# Patient Record
Sex: Male | Born: 1965 | Race: White | Hispanic: No | Marital: Married | Smoking: Never smoker
Health system: Southern US, Community
[De-identification: ages and names within clinical notes are randomized; demographics above are authoritative.]

## PROBLEM LIST (undated history)

## (undated) DIAGNOSIS — E119 Type 2 diabetes mellitus without complications: Secondary | ICD-10-CM

## (undated) DIAGNOSIS — I10 Essential (primary) hypertension: Secondary | ICD-10-CM

## (undated) HISTORY — PX: NO PAST SURGERIES: SHX2092

---

## 2007-12-01 ENCOUNTER — Ambulatory Visit: Payer: Self-pay | Admitting: Family Medicine

## 2007-12-01 DIAGNOSIS — R5383 Other fatigue: Secondary | ICD-10-CM

## 2007-12-01 DIAGNOSIS — R5381 Other malaise: Secondary | ICD-10-CM

## 2007-12-01 DIAGNOSIS — F17200 Nicotine dependence, unspecified, uncomplicated: Secondary | ICD-10-CM

## 2007-12-01 DIAGNOSIS — L0293 Carbuncle, unspecified: Secondary | ICD-10-CM

## 2007-12-01 DIAGNOSIS — R351 Nocturia: Secondary | ICD-10-CM

## 2007-12-01 DIAGNOSIS — Z720 Tobacco use: Secondary | ICD-10-CM | POA: Insufficient documentation

## 2007-12-01 DIAGNOSIS — Z7721 Contact with and (suspected) exposure to potentially hazardous body fluids: Secondary | ICD-10-CM | POA: Insufficient documentation

## 2007-12-01 DIAGNOSIS — L0292 Furuncle, unspecified: Secondary | ICD-10-CM | POA: Insufficient documentation

## 2007-12-01 DIAGNOSIS — G479 Sleep disorder, unspecified: Secondary | ICD-10-CM | POA: Insufficient documentation

## 2007-12-02 ENCOUNTER — Encounter: Payer: Self-pay | Admitting: Family Medicine

## 2007-12-02 LAB — CONVERTED CEMR LAB
AST: 29 units/L (ref 0–37)
Albumin: 3.8 g/dL (ref 3.5–5.2)
Alkaline Phosphatase: 70 units/L (ref 39–117)
BUN: 14 mg/dL (ref 6–23)
Basophils Relative: 2.6 % (ref 0.0–3.0)
Chloride: 106 meq/L (ref 96–112)
Creatinine, Ser: 1.1 mg/dL (ref 0.4–1.5)
Eosinophils Absolute: 0.4 10*3/uL (ref 0.0–0.7)
Eosinophils Relative: 5 % (ref 0.0–5.0)
Glucose, Bld: 88 mg/dL (ref 70–99)
HCT: 44.5 % (ref 39.0–52.0)
MCV: 95.5 fL (ref 78.0–100.0)
Monocytes Absolute: 0.3 10*3/uL (ref 0.1–1.0)
Monocytes Relative: 4.6 % (ref 3.0–12.0)
Platelets: 222 10*3/uL (ref 150–400)
Potassium: 4 meq/L (ref 3.5–5.1)
RBC: 4.66 M/uL (ref 4.22–5.81)
TSH: 2.1 microintl units/mL (ref 0.35–5.50)
Total Protein: 7.2 g/dL (ref 6.0–8.3)
WBC: 7.2 10*3/uL (ref 4.5–10.5)

## 2007-12-06 ENCOUNTER — Telehealth: Payer: Self-pay | Admitting: Family Medicine

## 2007-12-12 ENCOUNTER — Encounter: Payer: Self-pay | Admitting: Family Medicine

## 2008-01-02 ENCOUNTER — Ambulatory Visit: Payer: Self-pay | Admitting: Family Medicine

## 2008-01-08 LAB — CONVERTED CEMR LAB
HDL: 28.2 mg/dL — ABNORMAL LOW (ref >39.0)
LDL Cholesterol: 119 mg/dL — ABNORMAL HIGH (ref 0–99)
Total CHOL/HDL Ratio: 6.1
Triglycerides: 127 mg/dL (ref 0–149)
VLDL: 25 mg/dL (ref 0–40)

## 2009-10-20 ENCOUNTER — Emergency Department: Payer: Self-pay | Admitting: Emergency Medicine

## 2010-06-15 ENCOUNTER — Emergency Department: Payer: Self-pay | Admitting: Emergency Medicine

## 2010-09-09 DIAGNOSIS — E1169 Type 2 diabetes mellitus with other specified complication: Secondary | ICD-10-CM | POA: Insufficient documentation

## 2010-09-09 DIAGNOSIS — E7800 Pure hypercholesterolemia, unspecified: Secondary | ICD-10-CM | POA: Insufficient documentation

## 2010-09-09 DIAGNOSIS — I10 Essential (primary) hypertension: Secondary | ICD-10-CM | POA: Insufficient documentation

## 2012-03-01 IMAGING — CR DG CHEST 1V PORT
1 series · 1 of 1 positions shown · non-contrast
Comparison: none

REASON FOR EXAM: Chest Pain
COMMENTS:

PROCEDURE:     DXR - DXR PORTABLE CHEST SINGLE VIEW  - June 15, 2010  [DATE]
RESULT:     Comparison: None.

[view not recorded]
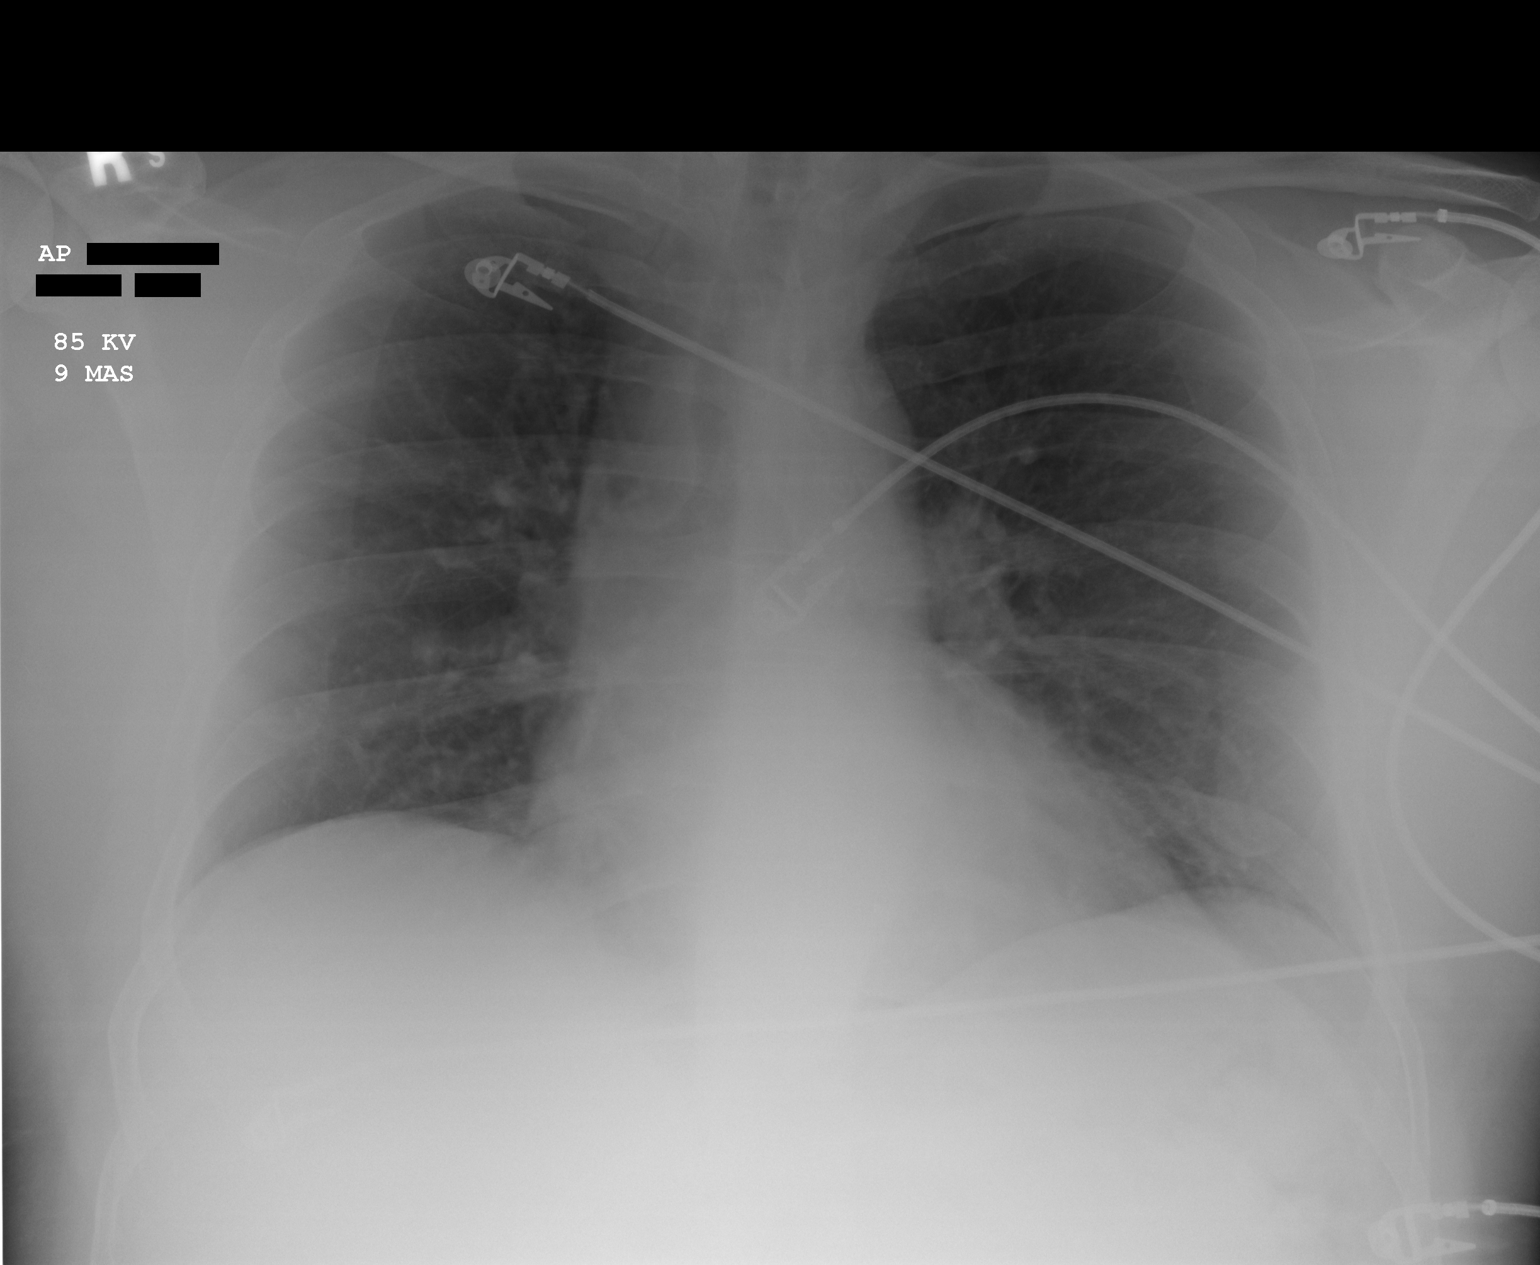

[1 of 1 positions shown; findings below may reference images not displayed]

FINDINGS: Heart size upper limits of normal. Mild prominence of the superior
mediastinum likely related to patient rotation to the left. The lung volumes
are slightly diminished. Otherwise, no focal pulmonary opacities.
IMPRESSION: No acute cardio pulmonary disease.

## 2012-12-13 DIAGNOSIS — E1165 Type 2 diabetes mellitus with hyperglycemia: Secondary | ICD-10-CM | POA: Insufficient documentation

## 2012-12-13 DIAGNOSIS — E119 Type 2 diabetes mellitus without complications: Secondary | ICD-10-CM | POA: Insufficient documentation

## 2013-08-24 DIAGNOSIS — M5417 Radiculopathy, lumbosacral region: Secondary | ICD-10-CM

## 2015-04-05 ENCOUNTER — Ambulatory Visit
Admission: EM | Admit: 2015-04-05 | Discharge: 2015-04-05 | Disposition: A | Discharge status: Home / Self Care | Payer: BC Managed Care – PPO | Attending: Family Medicine | Admitting: Family Medicine

## 2015-04-05 DIAGNOSIS — R0789 Other chest pain: Secondary | ICD-10-CM | POA: Diagnosis not present

## 2015-04-05 DIAGNOSIS — R0609 Other forms of dyspnea: Secondary | ICD-10-CM | POA: Diagnosis not present

## 2015-04-05 DIAGNOSIS — R059 Cough, unspecified: Secondary | ICD-10-CM

## 2015-04-05 DIAGNOSIS — R0600 Dyspnea, unspecified: Secondary | ICD-10-CM

## 2015-04-05 DIAGNOSIS — R079 Chest pain, unspecified: Secondary | ICD-10-CM

## 2015-04-05 DIAGNOSIS — R05 Cough: Secondary | ICD-10-CM

## 2015-04-05 HISTORY — DX: Type 2 diabetes mellitus without complications: E11.9

## 2015-04-05 HISTORY — DX: Essential (primary) hypertension: I10

## 2015-04-05 NOTE — Discharge Instructions (Signed)
Electrocardiography Electrocardiography is a test that records the electrical impulses of the heart. It assesses many aspects of heart health, including:  Heart function.  Heart rhythm.  Heart muscle thickness. Electrocardiography can be done as a routine part of a physical exam. It can also be done to evaluate symptoms such as severe chest pain and heart palpitations. PROCEDURE   Electrocardiography is simple, safe, and painless. No electricity goes through your body during the procedure.  You will be asked to remove your clothes from the waist up and lie on your back for the test.  Sticky patches (electrodes) will be placed on your chest, arms, and legs. The electrodes will be attached by wires to the electrocardiography machine.  You will be asked to relax and lie still for a few seconds while the electrocardiography machine records the electrical activity of your heart. AFTER THE PROCEDURE  If electrocardiography is part of a routine physical exam, you may return to normal activities as told by your health care provider.  Your health care provider or a heart doctor (cardiologist) will interpret the recording.  The test result may not be available during your visit. If your test result is not back during the visit, make an appointment with your health care provider to find out the result. It is your responsibility to get your test results.   This information is not intended to replace advice given to you by your health care provider. Make sure you discuss any questions you have with your health care provider.   Document Released: 01/17/2000 Document Revised: 02/09/2014 Document Reviewed: 06/01/2011 Elsevier Interactive Patient Education 2016 Else[employer] Inc.  Nonspecific Chest Pain  Chest pain can be caused by many different conditions. There is always a chance that your pain could be related to something serious, such as a heart attack or a blood clot in your lungs. Chest pain can  also be caused by conditions that are not life-threatening. If you have chest pain, it is very important to follow up with your health care provider. CAUSES  Chest pain can be caused by:  Heartburn.  Pneumonia or bronchitis.  Anxiety or stress.  Inflammation around your heart (pericarditis) or lung (pleuritis or pleurisy).  A blood clot in your lung.  A collapsed lung (pneumothorax). It can develop suddenly on its own (spontaneous pneumothorax) or from trauma to the chest.  Shingles infection (varicella-zoster virus).  Heart attack.  Damage to the bones, muscles, and cartilage that make up your chest wall. This can include:  Bruised bones due to injury.  Strained muscles or cartilage due to frequent or repeated coughing or overwork.  Fracture to one or more ribs.  Sore cartilage due to inflammation (costochondritis). RISK FACTORS  Risk factors for chest pain may include:  Activities that increase your risk for trauma or injury to your chest.  Respiratory infections or conditions that cause frequent coughing.  Medical conditions or overeating that can cause heartburn.  Heart disease or family history of heart disease.  Conditions or health behaviors that increase your risk of developing a blood clot.  Having had chicken pox (varicella zoster). SIGNS AND SYMPTOMS Chest pain can feel like:  Burning or tingling on the surface of your chest or deep in your chest.  Crushing, pressure, aching, or squeezing pain.  Dull or sharp pain that is worse when you move, cough, or take a deep breath.  Pain that is also felt in your back, neck, shoulder, or arm, or pain that spreads to any  of these areas. Your chest pain may come and go, or it may stay constant. DIAGNOSIS Lab tests or other studies may be needed to find the cause of your pain. Your health care provider may have you take a test called an ambulatory ECG (electrocardiogram). An ECG records your heartbeat patterns at  the time the test is performed. You may also have other tests, such as:  Transthoracic echocardiogram (TTE). During echocardiography, sound waves are used to create a picture of all of the heart structures and to look at how blood flows through your heart.  Transesophageal echocardiogram (TEE).This is a more advanced imaging test that obtains images from inside your body. It allows your health care provider to see your heart in finer detail.  Cardiac monitoring. This allows your health care provider to monitor your heart rate and rhythm in real time.  Holter monitor. This is a portable device that records your heartbeat and can help to diagnose abnormal heartbeats. It allows your health care provider to track your heart activity for several days, if needed.  Stress tests. These can be done through exercise or by taking medicine that makes your heart beat more quickly.  Blood tests.  Imaging tests. TREATMENT  Your treatment depends on what is causing your chest pain. Treatment may include:  Medicines. These may include:  Acid blockers for heartburn.  Anti-inflammatory medicine.  Pain medicine for inflammatory conditions.  Antibiotic medicine, if an infection is present.  Medicines to dissolve blood clots.  Medicines to treat coronary artery disease.  Supportive care for conditions that do not require medicines. This may include:  Resting.  Applying heat or cold packs to injured areas.  Limiting activities until pain decreases. HOME CARE INSTRUCTIONS  If you were prescribed an antibiotic medicine, finish it all even if you start to feel better.  Avoid any activities that bring on chest pain.  Do not use any tobacco products, including cigarettes, chewing tobacco, or electronic cigarettes. If you need help quitting, ask your health care provider.  Do not drink alcohol.  Take medicines only as directed by your health care provider.  Keep all follow-up visits as  directed by your health care provider. This is important. This includes any further testing if your chest pain does not go away.  If heartburn is the cause for your chest pain, you may be told to keep your head raised (elevated) while sleeping. This reduces the chance that acid will go from your stomach into your esophagus.  Make lifestyle changes as directed by your health care provider. These may include:  Getting regular exercise. Ask your health care provider to suggest some activities that are safe for you.  Eating a heart-healthy diet. A registered dietitian can help you to learn healthy eating options.  Maintaining a healthy weight.  Managing diabetes, if necessary.  Reducing stress. SEEK MEDICAL CARE IF:  Your chest pain does not go away after treatment.  You have a rash with blisters on your chest.  You have a fever. SEEK IMMEDIATE MEDICAL CARE IF:   Your chest pain is worse.  You have an increasing cough, or you cough up blood.  You have severe abdominal pain.  You have severe weakness.  You faint.  You have chills.  You have sudden, unexplained chest discomfort.  You have sudden, unexplained discomfort in your arms, back, neck, or jaw.  You have shortness of breath at any time.  You suddenly start to sweat, or your skin gets clammy.  You feel nauseous or you vomit.  You suddenly feel light-headed or dizzy.  Your heart begins to beat quickly, or it feels like it is skipping beats. These symptoms may represent a serious problem that is an emergency. Do not wait to see if the symptoms will go away. Get medical help right away. Call your local emergency services (911 in the [place] Do not drive yourself to the hospital.   This information is not intended to replace advice given to you by your health care provider. Make sure you discuss any questions you have with your health care provider.   Document Released: 10/29/2004 Document Revised: 02/09/2014 Document  Reviewed: 08/25/2013 Elsevier Interactive Patient Education [employer]2016 Elsevier Inc.

## 2015-04-05 NOTE — ED Notes (Signed)
Patient complains of cough, chest tightness and shortness of breath. Patient states that cough started days ago but, shortness of breath started around 4pm. Patient states that he feels like he is developing Asthma.

## 2015-04-05 NOTE — ED Provider Notes (Signed)
CSN: [id number]     Arrival date & time 04/05/15  1941 History   First MD Initiated Contact with Patient 04/05/15 2002     Chief Complaint  Patient presents with  . Cough   (Consider location/radiation/quality/duration/timing/severity/associated sxs/prior Treatment) HPI   This 50 year old male presents accompanied by his wife. He states that he said the lump several months history of waxing and waning chest tightness shortness of breath. He states that it is mostly noticed when he is in cold weather and with these exertional. Recently he states that it is not taken as much exertion in order to precipitate the symptoms. He's been using his wife's albuterol inhaler and today he has used approximately 8 cycles has not received very much benefit mainly for only 30 minutes and then the symptoms return. He's had chest tightness and shortness of breath particularly worse today at home when he got home from work and his wife said that he was so breathless he can hardly speak. Tightness has persisted but is lessened at that time he is seen in the clinic. He thought that he may possibly be developing asthma his mother had asthma similar symptoms but that except for the chest tightness. He has had a nonproductive cough. Risk factors include diabetes mellitus high blood pressure 30-pack-year history of smoking he quit 4 years ago. He denies any diaphoresis or radiation of the pain and says that the tightness is usually substernal. There is no nausea vomiting  Past Medical History  Diagnosis Date  . Diabetes (HCC)   . Hypertension    Past Surgical History  Procedure Laterality Date  . No past surgeries     Family History  Problem Relation Age of Onset  . Asthma Mother   . Cancer Father   . Barrett's esophagus Mother    Social History  Substance Use Topics  . Smoking status: Never Smoker   . Smokeless tobacco: None  . Alcohol Use: No    Review of Systems  Constitutional: Positive for activity  change. Negative for fever, chills and fatigue.  HENT: Positive for congestion.   Respiratory: Positive for cough, chest tightness, shortness of breath and wheezing.   All other systems reviewed and are negative.   Allergies  Penicillins  Home Medications   Prior to Admission medications   Medication Sig Start Date End Date Taking? Authorizing Provider  lisinopril (PRINIVIL,ZESTRIL) 10 MG tablet Take 10 mg by mouth daily.   Yes Historical Provider, MD  metFORMIN (GLUCOPHAGE) 500 MG tablet Take 1,000 mg by mouth 2 (two) times daily with a meal.   Yes Historical Provider, MD  metoprolol (LOPRESSOR) 50 MG tablet Take 50 mg by mouth 2 (two) times daily.   Yes Historical Provider, MD   Meds Ordered and Administered this Visit  Medications - No data to display  BP 153/91 mmHg  Pulse 66  Temp(Src) 98 F (36.7 C) (Oral)  Resp 17  Ht  (1.778 m)  SpO2 98% No data found.   Physical Exam  Constitutional: He is oriented to person, place, and time. He appears well-developed and well-nourished. No distress.  HENT:  Head: Normocephalic and atraumatic.  Right Ear: External ear normal.  Left Ear: External ear normal.  Nose: Nose normal.  Mouth/Throat: Oropharynx is clear and moist. No oropharyngeal exudate.  Eyes: Conjunctivae are normal. Pupils are equal, round, and reactive to light. Right eye exhibits no discharge. Left eye exhibits no discharge.  Neck: Normal range of motion. Neck supple.  Cardiovascular:  Normal rate, regular rhythm and normal heart sounds.  Exam reveals no gallop and no friction rub.   No murmur heard. Pulmonary/Chest: Effort normal and breath sounds normal. No respiratory distress. He has no wheezes. He has no rales.  Musculoskeletal: Normal range of motion. He exhibits no edema or tenderness.  Lymphadenopathy:    He has no cervical adenopathy.  Neurological: He is alert and oriented to person, place, and time.  Skin: Skin is warm and dry. He is not  diaphoretic.  Psychiatric: He has a normal mood and affect. His behavior is normal. Judgment and thought content normal.  Nursing note and vitals reviewed.   ED Course  Procedures (including critical care time)  Labs Review Labs Reviewed - No data to display  Imaging Review No results found.   Visual Acuity Review  Right Eye Distance:   Left Eye Distance:   Bilateral Distance:    Right Eye Near:   Left Eye Near:    Bilateral Near:     ED ECG REPORT   Date: 04/05/2015  EKG Time: 8:54 PM  Rate:65  Rhythm: normal sinus rhythm,  normal EKG, normal sinus rhythm,  there are no previous tracings available for comparison  Axis: Normal  Intervals:none  ST&T Change: No acute changes  Narrative Interpretation:Normal EKG without acute changes        EKG was interpreted by myself the undersigned and agree with the interpretation of the computer. Chrissie NoaWilliam P Sava Proby,PA-C   MDM  Chest pain,atypical R/O angina.,Nstemi Cough Exertional dyspnea. Multiple cardiac risk factors  Discharge Medication List as of 04/05/2015  8:34 PM    Plan: 1. Test/x-ray results and diagnosis reviewed with patient 2. rx as per orders; risks, benefits, potential side effects reviewed with patient 3. Recommend supportive treatment with transporttation to emergency department for definitive workup and treatment. I have had a long discussion with the patient and his wife regarding his symptoms, his risk factors and , findings tonight. Very concerned with the presentation tonight and have impressed upon them the need for further workup to rule out a cardiac source for his symptoms. When seen in the clinic his symptoms had subsided quite differently than what he is explaining earlier in the afternoon ;his EKG, although normal without any acute changes, does not definitively rule out a cardiac source. He is is currently not having any discomfort and is stable. his [facility]  [facility]. They asked if they could a wait and go tomorrow but I have told them that it is my opinion and judgment that he needs to be seen tonight for definitive workup. They are agreement the wife does appears to be worried and thought that this might be what  His  Sx represented ;stated that they will make arrangements for her children to be taken care of at home and she will [facility]. 4. [facility]   Lutricia FeilWilliam P Arraya Buck, PA-C 04/05/15 2054

## 2015-05-16 DIAGNOSIS — J449 Chronic obstructive pulmonary disease, unspecified: Secondary | ICD-10-CM | POA: Insufficient documentation

## 2018-09-21 ENCOUNTER — Other Ambulatory Visit: Payer: Self-pay

## 2018-09-21 ENCOUNTER — Other Ambulatory Visit: Payer: Self-pay | Admitting: Family Medicine

## 2018-09-21 ENCOUNTER — Ambulatory Visit
Admission: RE | Admit: 2018-09-21 | Discharge: 2018-09-21 | Disposition: A | Discharge status: Home / Self Care | Payer: BC Managed Care – PPO | Source: Ambulatory Visit | Attending: Family Medicine | Admitting: Family Medicine

## 2018-09-21 ENCOUNTER — Ambulatory Visit
Admission: RE | Admit: 2018-09-21 | Discharge: 2018-09-21 | Disposition: A | Discharge status: Home / Self Care | Payer: BC Managed Care – PPO | Attending: Family Medicine | Admitting: Family Medicine

## 2018-09-21 DIAGNOSIS — R52 Pain, unspecified: Secondary | ICD-10-CM

## 2020-06-07 IMAGING — CR LEFT FOOT - COMPLETE 3+ VIEW
3 series · 3 of 3 positions shown · non-contrast
Comparison: None.

CLINICAL DATA: Pain, heel mass

EXAM:
LEFT FOOT - COMPLETE 3+ VIEW

[foot ap]
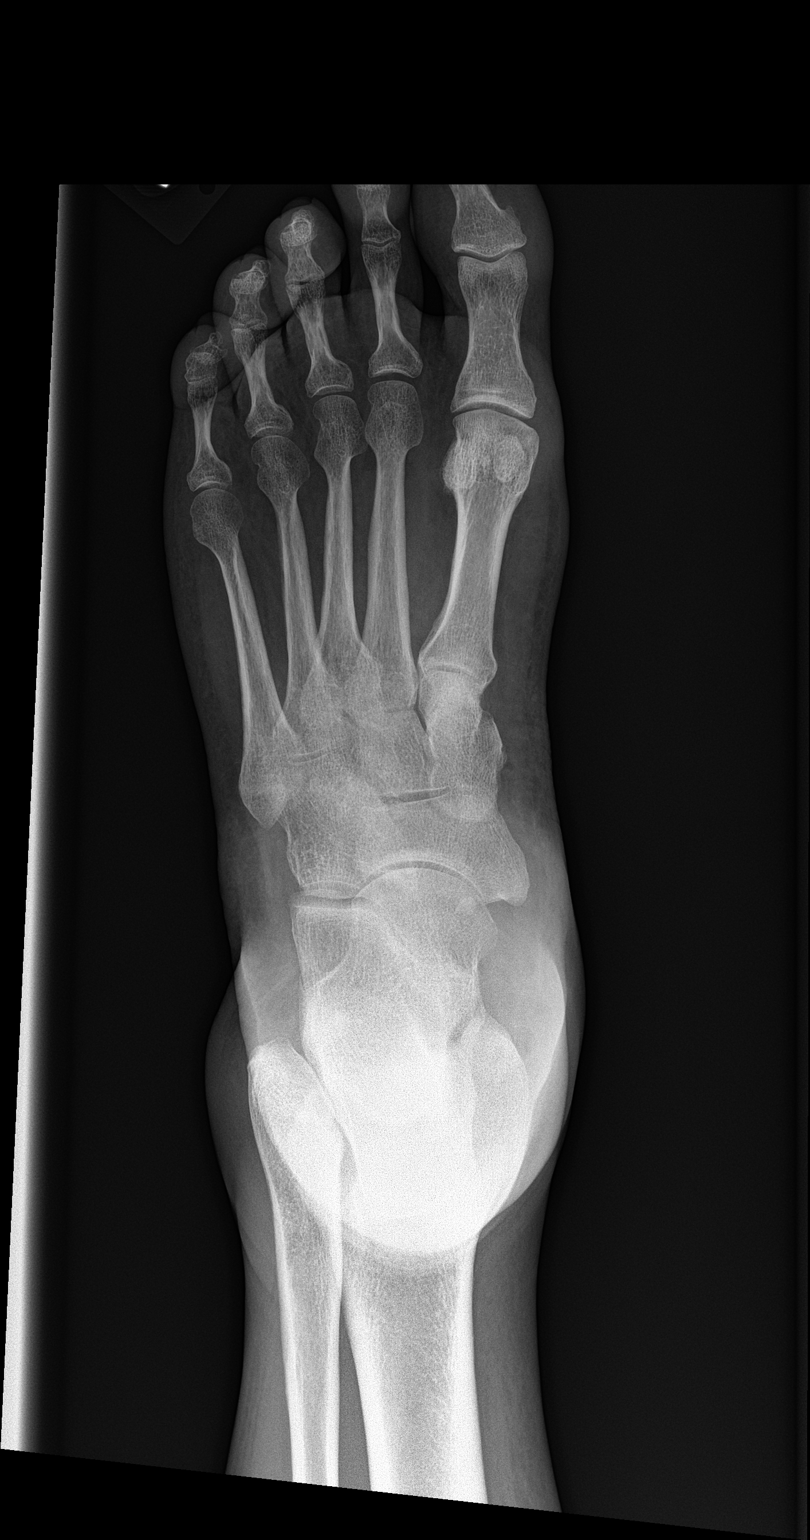

[foot obl]
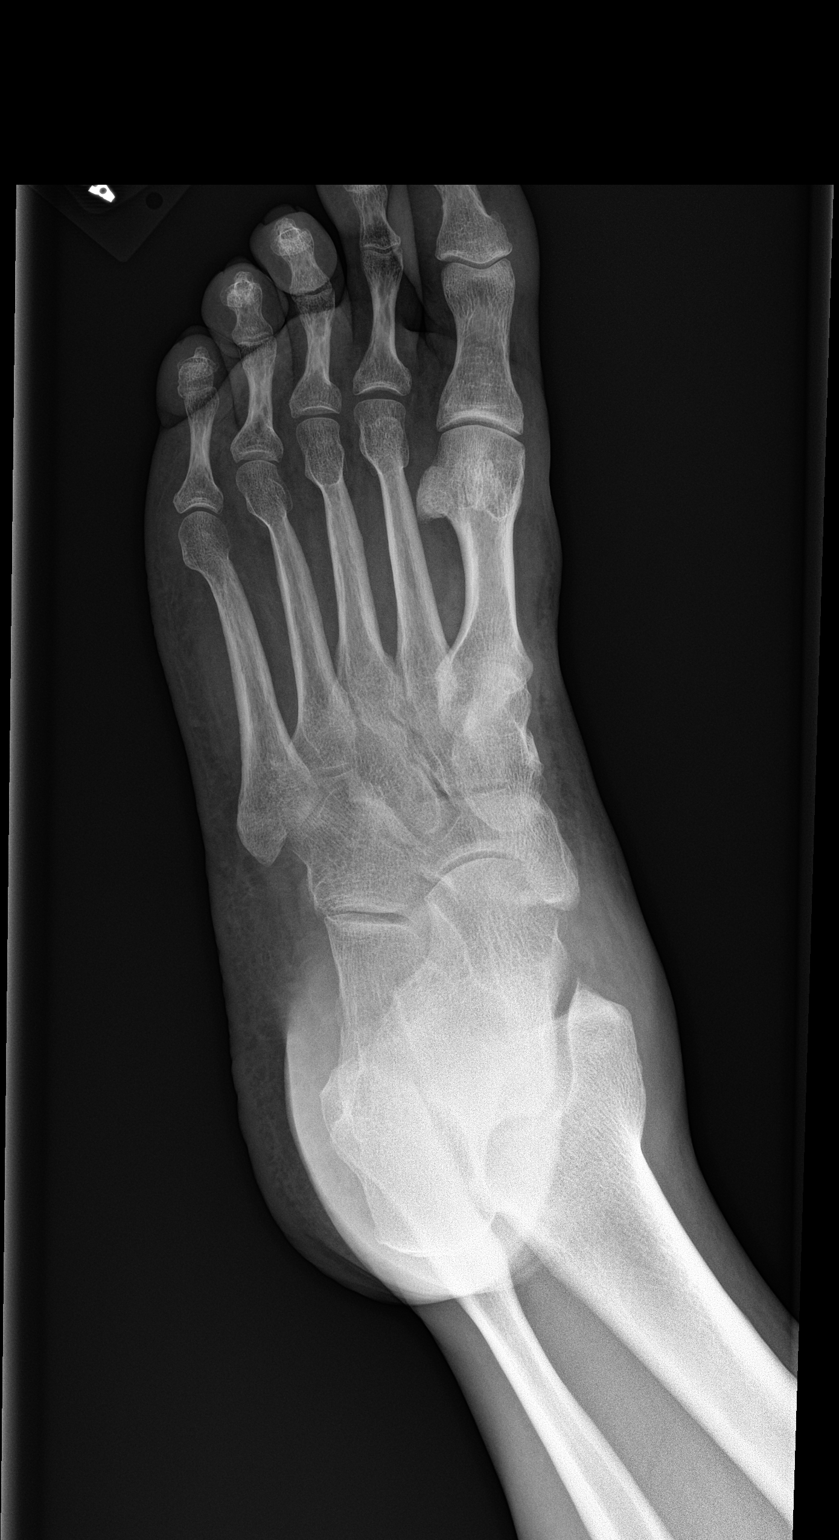

[foot lat]
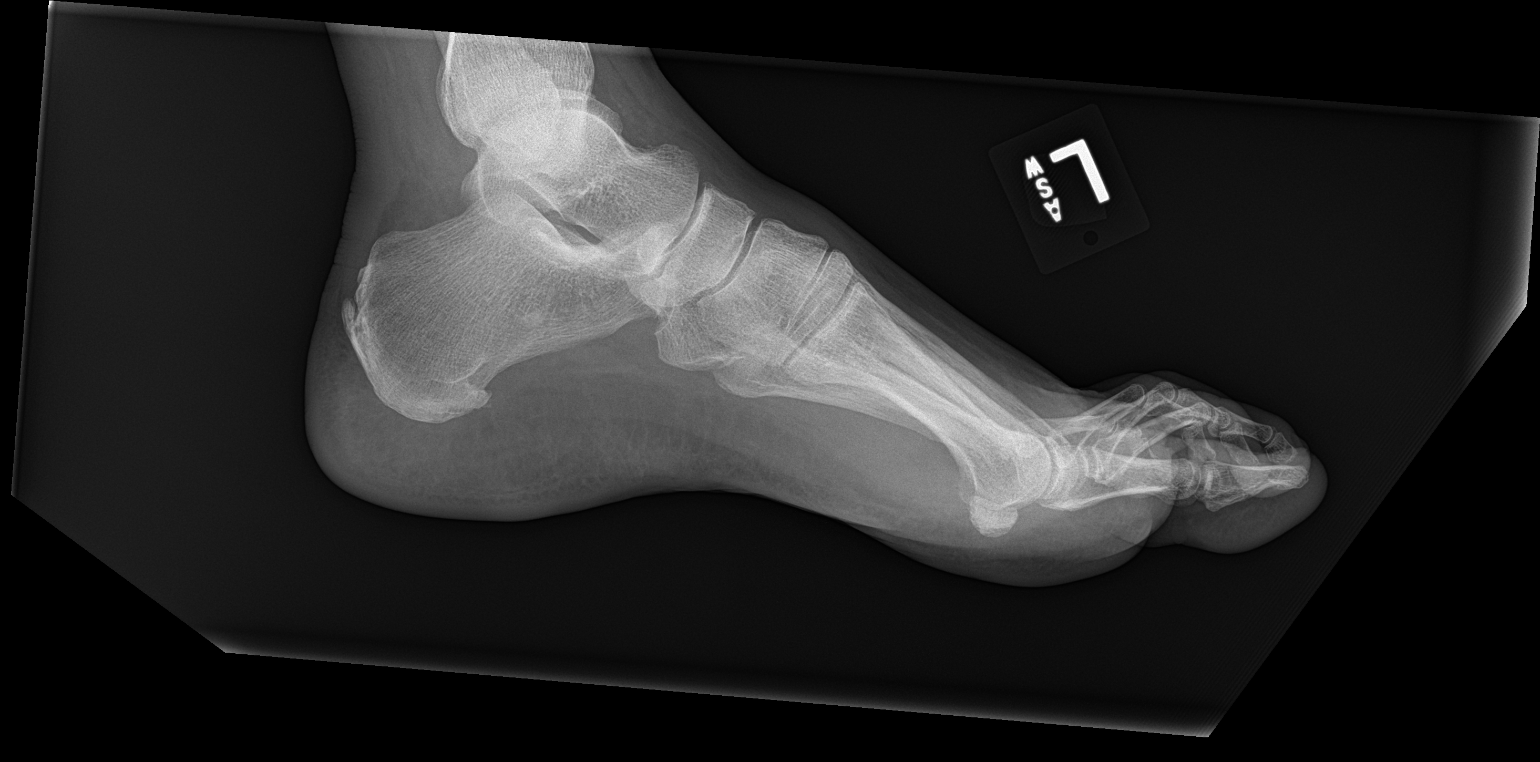

[3 of 3 positions shown; findings below may reference images not displayed]

FINDINGS: No fracture or dislocation of the left foot. Small plantar and
Achilles calcaneal spurs. Joint spaces are well preserved. There is
soft tissue thickening of the plantar heel.
IMPRESSION: No fracture or dislocation of the left foot. Small plantar and
Achilles calcaneal spurs. Joint spaces are well preserved. There is
soft tissue thickening of the plantar heel. Consider contrast
enhanced MRI or targeted ultrasound to further evaluate soft tissue
abnormality of the heel.

## 2022-05-14 ENCOUNTER — Encounter: Payer: Self-pay | Admitting: Family

## 2022-05-14 ENCOUNTER — Ambulatory Visit: Payer: Self-pay | Admitting: Family

## 2022-05-14 VITALS — BP 130/90 | HR 105 | Ht 72.0 in | Wt 226.2 lb

## 2022-05-14 DIAGNOSIS — E559 Vitamin D deficiency, unspecified: Secondary | ICD-10-CM

## 2022-05-14 DIAGNOSIS — E119 Type 2 diabetes mellitus without complications: Secondary | ICD-10-CM

## 2022-05-14 DIAGNOSIS — E7800 Pure hypercholesterolemia, unspecified: Secondary | ICD-10-CM

## 2022-05-14 DIAGNOSIS — E1165 Type 2 diabetes mellitus with hyperglycemia: Secondary | ICD-10-CM

## 2022-05-14 DIAGNOSIS — E538 Deficiency of other specified B group vitamins: Secondary | ICD-10-CM

## 2022-05-14 DIAGNOSIS — Z794 Long term (current) use of insulin: Secondary | ICD-10-CM

## 2022-05-14 LAB — GLUCOSE, POCT (MANUAL RESULT ENTRY): POC Glucose: 329 mg/dl — AB (ref 70–99)

## 2022-05-15 LAB — LIPID PANEL
Chol/HDL Ratio: 7.7 ratio — ABNORMAL HIGH (ref 0.0–5.0)
Cholesterol, Total: 261 mg/dL — ABNORMAL HIGH (ref 100–199)
HDL: 34 mg/dL — ABNORMAL LOW (ref >39)
LDL Chol Calc (NIH): 112 mg/dL — ABNORMAL HIGH (ref 0–99)
Triglycerides: 653 mg/dL (ref 0–149)
VLDL Cholesterol Cal: 115 mg/dL — ABNORMAL HIGH (ref 5–40)

## 2022-05-15 LAB — VITAMIN D 25 HYDROXY (VIT D DEFICIENCY, FRACTURES): Vit D, 25-Hydroxy: 11.5 ng/mL — ABNORMAL LOW (ref 30.0–100.0)

## 2022-05-15 LAB — HEMOGLOBIN A1C
Est. average glucose Bld gHb Est-mCnc: 349 mg/dL
Hgb A1c MFr Bld: 13.8 % — ABNORMAL HIGH (ref 4.8–5.6)

## 2022-05-15 LAB — CBC WITH DIFFERENTIAL
Basophils Absolute: 0.1 10*3/uL (ref 0.0–0.2)
Basos: 1 %
EOS (ABSOLUTE): 0.2 10*3/uL (ref 0.0–0.4)
Eos: 2 %
Hematocrit: 46.5 % (ref 37.5–51.0)
Hemoglobin: 15.6 g/dL (ref 13.0–17.7)
Immature Grans (Abs): 0 10*3/uL (ref 0.0–0.1)
Immature Granulocytes: 1 %
Lymphocytes Absolute: 2.1 10*3/uL (ref 0.7–3.1)
Lymphs: 26 %
MCH: 31.4 pg (ref 26.6–33.0)
MCHC: 33.5 g/dL (ref 31.5–35.7)
MCV: 94 fL (ref 79–97)
Monocytes Absolute: 0.6 10*3/uL (ref 0.1–0.9)
Monocytes: 8 %
Neutrophils Absolute: 5 10*3/uL (ref 1.4–7.0)
Neutrophils: 62 %
RBC: 4.97 x10E6/uL (ref 4.14–5.80)
RDW: 12.3 % (ref 11.6–15.4)
WBC: 7.9 10*3/uL (ref 3.4–10.8)

## 2022-05-15 LAB — CMP14+EGFR
ALT: 30 IU/L (ref 0–44)
AST: 18 IU/L (ref 0–40)
Albumin/Globulin Ratio: 1.6 (ref 1.2–2.2)
Albumin: 4.4 g/dL (ref 3.8–4.9)
Alkaline Phosphatase: 77 IU/L (ref 44–121)
BUN/Creatinine Ratio: 15 (ref 9–20)
BUN: 12 mg/dL (ref 6–24)
Bilirubin Total: 0.6 mg/dL (ref 0.0–1.2)
CO2: 25 mmol/L (ref 20–29)
Calcium: 9.3 mg/dL (ref 8.7–10.2)
Chloride: 95 mmol/L — ABNORMAL LOW (ref 96–106)
Creatinine, Ser: 0.8 mg/dL (ref 0.76–1.27)
Globulin, Total: 2.7 g/dL (ref 1.5–4.5)
Glucose: 293 mg/dL — ABNORMAL HIGH (ref 70–99)
Potassium: 4.1 mmol/L (ref 3.5–5.2)
Sodium: 136 mmol/L (ref 134–144)
Total Protein: 7.1 g/dL (ref 6.0–8.5)
eGFR: 105 mL/min/{1.73_m2} (ref >59)

## 2022-05-15 LAB — VITAMIN B12: Vitamin B-12: 558 pg/mL (ref 232–1245)

## 2022-05-15 LAB — TSH: TSH: 0.939 u[IU]/mL (ref 0.450–4.500)

## 2022-05-16 ENCOUNTER — Encounter: Payer: Self-pay | Admitting: Family

## 2022-05-16 NOTE — Progress Notes (Signed)
New Patient Office Visit  Subjective    Patient ID: Peter Marks, male    DOB: [birth year]  Age: 57 y.o. MRN: [id number]  CC:  Chief Complaint  Patient presents with   Establish Care    NPE    HPI Peter Marks presents to establish care Previous Primary Care provider/office:    He does have additional concerns to discuss today.  He has a history significant for DM, HTN, and HLD.  He has not been to a doctor for a while, as his previous provider moved and he just did not find an a new provider.   He has not been on his medications for quite sometime.   Needs labs today.  No other concerns at this time.     Outpatient Encounter Medications as of 05/14/2022  Medication Sig   albuterol (VENTOLIN HFA) 108 (90 Base) MCG/ACT inhaler Inhale 1 puff into the lungs as needed for wheezing or shortness of breath.   glipiZIDE (GLUCOTROL) 10 MG tablet Take 10 mg by mouth daily before breakfast.   metoprolol tartrate (LOPRESSOR) 25 MG tablet Take 25 mg by mouth 2 (two) times daily.   lisinopril (PRINIVIL,ZESTRIL) 10 MG tablet Take 10 mg by mouth daily. (Patient not taking: Reported on 05/14/2022)   metFORMIN (GLUCOPHAGE) 500 MG tablet Take 1,000 mg by mouth 2 (two) times daily with a meal. (Patient not taking: Reported on 05/14/2022)   [DISCONTINUED] metoprolol (LOPRESSOR) 50 MG tablet Take 50 mg by mouth 2 (two) times daily. (Patient not taking: Reported on 05/14/2022)   No facility-administered encounter medications on file as of 05/14/2022.    Past Medical History:  Diagnosis Date   Diabetes    Hypertension    Lumbosacral radiculopathy at S1 08/24/2013    Past Surgical History:  Procedure Laterality Date   NO PAST SURGERIES      Family History  Problem Relation Age of Onset   Asthma Mother    Cancer Father    Barrett's esophagus Mother     Social History   Socioeconomic History   Marital status: Married    Spouse name: Not on file   Number of children:  Not on file   Years of education: Not on file   Highest education level: Not on file  Occupational History   Not on file  Tobacco Use   Smoking status: Never   Smokeless tobacco: Not on file  Substance and Sexual Activity   Alcohol use: No    Alcohol/week: 0.0 standard drinks of alcohol   Drug use: No   Sexual activity: Not on file  Other Topics Concern   Not on file  Social History Narrative   Not on file   Social Determinants of Health   Financial Resource Strain: Not on file  Food Insecurity: Not on file  Transportation Needs: Not on file  Physical Activity: Not on file  Stress: Not on file  Social Connections: Not on file  Intimate Partner Violence: Not on file    Review of Systems  All other systems reviewed and are negative.       Objective    BP (!) 130/90   Pulse (!) 105   Ht 6' (1.829 m)   Wt 226 lb 3.2 oz (102.6 kg)   SpO2 94%   BMI 30.68 kg/m   Physical Exam Vitals and nursing note reviewed.  Constitutional:      Appearance: Normal appearance. He is normal weight.  Eyes:  Pupils: Pupils are equal, round, and reactive to light.  Cardiovascular:     Rate and Rhythm: Normal rate and regular rhythm.     Pulses: Normal pulses.     Heart sounds: Normal heart sounds.  Pulmonary:     Effort: Pulmonary effort is normal.     Breath sounds: Normal breath sounds.  Neurological:     Mental Status: He is alert.  Psychiatric:        Mood and Affect: Mood normal.        Behavior: Behavior normal.        Assessment & Plan:   Problem List Items Addressed This Visit     Pure hypercholesterolemia   Relevant Medications   metoprolol tartrate (LOPRESSOR) 25 MG tablet   Other Relevant Orders   Lipid panel (Completed)   CBC With Differential (Completed)   CMP14+EGFR (Completed)   TSH (Completed)   Type 2 diabetes mellitus without complications - Primary   Relevant Medications   glipiZIDE (GLUCOTROL) 10 MG tablet   Other Relevant Orders   POCT  Glucose (CBG) (Completed)   CBC With Differential (Completed)   CMP14+EGFR (Completed)   TSH (Completed)   Hemoglobin A1c (Completed)   Other Visit Diagnoses     Vitamin D deficiency, unspecified       Relevant Orders   VITAMIN D 25 Hydroxy (Vit-D Deficiency, Fractures) (Completed)   CBC With Differential (Completed)   CMP14+EGFR (Completed)   TSH (Completed)   B12 deficiency due to diet       Relevant Orders   Vitamin B12 (Completed)       Return in about 2 weeks (around 05/28/2022) for NP F/U.   Total time spent: 45 minutes  Miki Kins, FNP  05/14/2022

## 2022-05-28 ENCOUNTER — Ambulatory Visit: Payer: Self-pay | Admitting: Family

## 2022-05-28 VITALS — BP 140/78 | HR 89 | Ht 72.0 in | Wt 227.0 lb

## 2022-05-28 DIAGNOSIS — E1165 Type 2 diabetes mellitus with hyperglycemia: Secondary | ICD-10-CM

## 2022-05-28 DIAGNOSIS — E782 Mixed hyperlipidemia: Secondary | ICD-10-CM

## 2022-05-28 DIAGNOSIS — Z794 Long term (current) use of insulin: Secondary | ICD-10-CM

## 2022-05-28 DIAGNOSIS — E1169 Type 2 diabetes mellitus with other specified complication: Secondary | ICD-10-CM

## 2022-05-28 LAB — POCT CBG (FASTING - GLUCOSE)-MANUAL ENTRY: Glucose Fasting, POC: 387 mg/dL — AB (ref 70–99)

## 2022-05-28 MED ORDER — VITAMIN D (ERGOCALCIFEROL) 1.25 MG (50000 UNIT) PO CAPS: 50000.0000 [IU] | ORAL_CAPSULE | ORAL | 1 refills | Status: AC

## 2022-05-28 MED ORDER — ROSUVASTATIN CALCIUM 10 MG PO TABS: 10.0000 mg | ORAL_TABLET | Freq: Every day | ORAL | 0 refills | Status: DC

## 2022-05-28 MED ORDER — LOSARTAN POTASSIUM 50 MG PO TABS: 50.0000 mg | ORAL_TABLET | Freq: Every day | ORAL | 1 refills | Status: AC

## 2022-05-31 ENCOUNTER — Encounter: Payer: Self-pay | Admitting: Family

## 2022-05-31 NOTE — Progress Notes (Signed)
Established Patient Office Visit  Subjective:  Patient ID: Peter Marks, male    DOB: 1965-02-28  Age: 57 y.o. MRN: [id number]  Chief Complaint  Patient presents with   Follow-up    2 week follow up    Pt. Here today for 2 week n/p follow up.   Had labs done at that time, so we will review in detail today.   Labs: Patient's cholesterol (triglycerides and LDL) was very elevated, A1C was also quite high. He has not been taking any of his meds, so this is likely a part of this. He also admits that his diet is not great.   No other concerns at this time.   Past Medical History:  Diagnosis Date   Diabetes (HCC)    Hypertension    Lumbosacral radiculopathy at S1 08/24/2013    Past Surgical History:  Procedure Laterality Date   NO PAST SURGERIES      Social History   Socioeconomic History   Marital status: Married    Spouse name: Not on file   Number of children: Not on file   Years of education: Not on file   Highest education level: Not on file  Occupational History   Not on file  Tobacco Use   Smoking status: Never   Smokeless tobacco: Not on file  Substance and Sexual Activity   Alcohol use: No    Alcohol/week: 0.0 standard drinks of alcohol   Drug use: No   Sexual activity: Not on file  Other Topics Concern   Not on file  Social History Narrative   Not on file   Social Determinants of Health   Financial Resource Strain: Not on file  Food Insecurity: Not on file  Transportation Needs: Not on file  Physical Activity: Not on file  Stress: Not on file  Social Connections: Not on file  Intimate Partner Violence: Not on file    Family History  Problem Relation Age of Onset   Asthma Mother    Cancer Father    Barrett's esophagus Mother     Allergies  Allergen Reactions   Penicillins Nausea And Vomiting    Review of Systems  All other systems reviewed and are negative.      Objective:   BP (!) 140/78   Pulse 89   Ht 6' (1.829 m)    Wt 227 lb (103 kg)   SpO2 95%   BMI 30.79 kg/m   Vitals:   05/28/22 0936  BP: (!) 140/78  Pulse: 89  Height: 6' (1.829 m)  Weight: 227 lb (103 kg)  SpO2: 95%  BMI (Calculated): 30.78    Physical Exam Vitals and nursing note reviewed.  Constitutional:      Appearance: Normal appearance. He is obese.  Eyes:     Extraocular Movements: Extraocular movements intact.     Conjunctiva/sclera: Conjunctivae normal.     Pupils: Pupils are equal, round, and reactive to light.  Cardiovascular:     Rate and Rhythm: Normal rate and regular rhythm.     Pulses: Normal pulses.     Heart sounds: Normal heart sounds.  Pulmonary:     Effort: Pulmonary effort is normal.     Breath sounds: Normal breath sounds.  Neurological:     Mental Status: He is alert.  Psychiatric:        Mood and Affect: Mood normal.        Behavior: Behavior normal.        Thought Content:  Thought content normal.        Judgment: Judgment normal.      Results for orders placed or performed in visit on 05/28/22  POCT CBG (Fasting - Glucose)  Result Value Ref Range   Glucose Fasting, POC 387 (A) 70 - 99 mg/dL    Recent Results (from the past 2160 hour(s))  POCT Glucose (CBG)     Status: Abnormal   Collection Time: 05/14/22  1:16 PM  Result Value Ref Range   POC Glucose 329 (A) 70 - 99 mg/dl  Lipid panel     Status: Abnormal   Collection Time: 05/14/22  2:03 PM  Result Value Ref Range   Cholesterol, Total 261 (H) 100 - 199 mg/dL   Triglycerides 696 (HH) 0 - 149 mg/dL   HDL 34 (L) >29 mg/dL   VLDL Cholesterol Cal 115 (H) 5 - 40 mg/dL   LDL Chol Calc (NIH) 528 (H) 0 - 99 mg/dL   Chol/HDL Ratio 7.7 (H) 0.0 - 5.0 ratio    Comment:                                   T. Chol/HDL Ratio                                             Men  Women                               1/2 Avg.Risk  3.4    3.3                                   Avg.Risk  5.0    4.4                                2X Avg.Risk  9.6    7.1                                 3X Avg.Risk 23.4   11.0   VITAMIN D 25 Hydroxy (Vit-D Deficiency, Fractures)     Status: Abnormal   Collection Time: 05/14/22  2:03 PM  Result Value Ref Range   Vit D, 25-Hydroxy 11.5 (L) 30.0 - 100.0 ng/mL    Comment: Vitamin D deficiency has been defined by the Institute of Medicine and an Endocrine Society practice guideline as a level of serum 25-OH vitamin D less than 20 ng/mL (1,2). The Endocrine Society went on to further define vitamin D insufficiency as a level between 21 and 29 ng/mL (2). 1. IOM (Institute of Medicine). 2010. Dietary reference    intakes for calcium and D. [place]: The    [employer]. 2. Holick MF, Binkley Angie, Bischoff-Ferrari HA, et al.    Evaluation, treatment, and prevention of vitamin D    deficiency: an Endocrine Society clinical practice    guideline. JCEM. 2011 Jul; 96(7):1911-30.   CBC With Differential     Status: None   Collection Time: 05/14/22  2:03 PM  Result Value Ref Range   WBC 7.9  3.4 - 10.8 x10E3/uL   RBC 4.97 4.14 - 5.80 x10E6/uL   Hemoglobin 15.6 13.0 - 17.7 g/dL   Hematocrit 40.9 81.1 - 51.0 %   MCV 94 79 - 97 fL   MCH 31.4 26.6 - 33.0 pg   MCHC 33.5 31.5 - 35.7 g/dL   RDW 91.4 78.2 - 95.6 %   Neutrophils 62 Not Estab. %   Lymphs 26 Not Estab. %   Monocytes 8 Not Estab. %   Eos 2 Not Estab. %   Basos 1 Not Estab. %   Neutrophils Absolute 5.0 1.4 - 7.0 x10E3/uL   Lymphocytes Absolute 2.1 0.7 - 3.1 x10E3/uL   Monocytes Absolute 0.6 0.1 - 0.9 x10E3/uL   EOS (ABSOLUTE) 0.2 0.0 - 0.4 x10E3/uL   Basophils Absolute 0.1 0.0 - 0.2 x10E3/uL   Immature Granulocytes 1 Not Estab. %   Immature Grans (Abs) 0.0 0.0 - 0.1 x10E3/uL  CMP14+EGFR     Status: Abnormal   Collection Time: 05/14/22  2:03 PM  Result Value Ref Range   Glucose 293 (H) 70 - 99 mg/dL   BUN 12 6 - 24 mg/dL   Creatinine, Ser 2.13 0.76 - 1.27 mg/dL   eGFR 086 >57 QI/ONG/2.95   BUN/Creatinine Ratio 15 9 - 20   Sodium 136 134 -  144 mmol/L   Potassium 4.1 3.5 - 5.2 mmol/L   Chloride 95 (L) 96 - 106 mmol/L   CO2 25 20 - 29 mmol/L   Calcium 9.3 8.7 - 10.2 mg/dL   Total Protein 7.1 6.0 - 8.5 g/dL   Albumin 4.4 3.8 - 4.9 g/dL   Globulin, Total 2.7 1.5 - 4.5 g/dL   Albumin/Globulin Ratio 1.6 1.2 - 2.2   Bilirubin Total 0.6 0.0 - 1.2 mg/dL   Alkaline Phosphatase 77 44 - 121 IU/L   AST 18 0 - 40 IU/L   ALT 30 0 - 44 IU/L  TSH     Status: None   Collection Time: 05/14/22  2:03 PM  Result Value Ref Range   TSH 0.939 0.450 - 4.500 uIU/mL  Hemoglobin A1c     Status: Abnormal   Collection Time: 05/14/22  2:03 PM  Result Value Ref Range   Hgb A1c MFr Bld 13.8 (H) 4.8 - 5.6 %    Comment:          Prediabetes: 5.7 - 6.4          Diabetes: >6.4          Glycemic control for adults with diabetes: <7.0    Est. average glucose Bld gHb Est-mCnc 349 mg/dL  Vitamin M84     Status: None   Collection Time: 05/14/22  2:03 PM  Result Value Ref Range   Vitamin B-12 558 232 - 1,245 pg/mL  POCT CBG (Fasting - Glucose)     Status: Abnormal   Collection Time: 05/28/22  9:40 AM  Result Value Ref Range   Glucose Fasting, POC 387 (A) 70 - 99 mg/dL       Assessment & Plan:   Problem List Items Addressed This Visit       Endocrine   Type 2 diabetes mellitus with hyperglycemia (HCC) - Primary    Starting patient on Farxiga.  I would also like to put him on a GLP-1, but he has no insurance so they are all quite expensive.  We will see what the bloodwork looks like when I am able to get him back to  Relevant Medications   rosuvastatin (CRESTOR) 10 MG tablet   losartan (COZAAR) 50 MG tablet   Other Relevant Orders   POCT CBG (Fasting - Glucose) (Completed)   Mixed diabetic hyperlipidemia associated with type 2 diabetes mellitus (HCC)   Relevant Medications   rosuvastatin (CRESTOR) 10 MG tablet   losartan (COZAAR) 50 MG tablet   Other Visit Diagnoses     Mixed hyperlipidemia       Relevant Medications    rosuvastatin (CRESTOR) 10 MG tablet   losartan (COZAAR) 50 MG tablet       No follow-ups on file.   Total time spent: 30 minutes  Miki Kins, FNP  05/28/2022

## 2022-05-31 NOTE — Assessment & Plan Note (Addendum)
Starting patient on Farxiga.  I would also like to put him on a GLP-1, but he has no insurance so they are all quite expensive.  We will see what the bloodwork looks like when I am able to get him back to

## 2022-08-27 ENCOUNTER — Ambulatory Visit: Payer: Self-pay | Admitting: Family

## 2022-09-22 ENCOUNTER — Other Ambulatory Visit: Payer: Self-pay | Admitting: Family

## 2022-11-06 ENCOUNTER — Ambulatory Visit: Payer: Self-pay | Admitting: Family
